# Patient Record
Sex: Male | Born: 1965 | Race: White | Hispanic: No | Smoking: Never smoker
Health system: Southern US, Community
[De-identification: ages and names within clinical notes are randomized; demographics above are authoritative.]

## PROBLEM LIST (undated history)

## (undated) DIAGNOSIS — G44009 Cluster headache syndrome, unspecified, not intractable: Secondary | ICD-10-CM

## (undated) DIAGNOSIS — I10 Essential (primary) hypertension: Secondary | ICD-10-CM

## (undated) DIAGNOSIS — F32A Depression, unspecified: Secondary | ICD-10-CM

## (undated) DIAGNOSIS — F329 Major depressive disorder, single episode, unspecified: Secondary | ICD-10-CM

---
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
## 2015-05-17 DIAGNOSIS — N182 Chronic kidney disease, stage 2 (mild): Secondary | ICD-10-CM | POA: Diagnosis not present

## 2015-05-17 DIAGNOSIS — F331 Major depressive disorder, recurrent, moderate: Secondary | ICD-10-CM | POA: Diagnosis not present

## 2015-05-17 DIAGNOSIS — Z6826 Body mass index (BMI) 26.0-26.9, adult: Secondary | ICD-10-CM | POA: Diagnosis not present

## 2015-05-17 DIAGNOSIS — I10 Essential (primary) hypertension: Secondary | ICD-10-CM | POA: Diagnosis not present

## 2015-10-01 DIAGNOSIS — Z6826 Body mass index (BMI) 26.0-26.9, adult: Secondary | ICD-10-CM | POA: Diagnosis not present

## 2015-10-01 DIAGNOSIS — F331 Major depressive disorder, recurrent, moderate: Secondary | ICD-10-CM | POA: Diagnosis not present

## 2015-10-01 DIAGNOSIS — N182 Chronic kidney disease, stage 2 (mild): Secondary | ICD-10-CM | POA: Diagnosis not present

## 2015-10-01 DIAGNOSIS — I10 Essential (primary) hypertension: Secondary | ICD-10-CM | POA: Diagnosis not present

## 2016-01-17 DIAGNOSIS — Z6825 Body mass index (BMI) 25.0-25.9, adult: Secondary | ICD-10-CM | POA: Diagnosis not present

## 2016-01-17 DIAGNOSIS — F331 Major depressive disorder, recurrent, moderate: Secondary | ICD-10-CM | POA: Diagnosis not present

## 2016-01-17 DIAGNOSIS — N182 Chronic kidney disease, stage 2 (mild): Secondary | ICD-10-CM | POA: Diagnosis not present

## 2016-01-17 DIAGNOSIS — I10 Essential (primary) hypertension: Secondary | ICD-10-CM | POA: Diagnosis not present

## 2016-04-21 DIAGNOSIS — I10 Essential (primary) hypertension: Secondary | ICD-10-CM | POA: Diagnosis not present

## 2016-04-21 DIAGNOSIS — F331 Major depressive disorder, recurrent, moderate: Secondary | ICD-10-CM | POA: Diagnosis not present

## 2016-04-21 DIAGNOSIS — Z6825 Body mass index (BMI) 25.0-25.9, adult: Secondary | ICD-10-CM | POA: Diagnosis not present

## 2016-04-21 DIAGNOSIS — N182 Chronic kidney disease, stage 2 (mild): Secondary | ICD-10-CM | POA: Diagnosis not present

## 2016-04-21 DIAGNOSIS — Z125 Encounter for screening for malignant neoplasm of prostate: Secondary | ICD-10-CM | POA: Diagnosis not present

## 2016-05-15 DIAGNOSIS — Z1211 Encounter for screening for malignant neoplasm of colon: Secondary | ICD-10-CM | POA: Diagnosis not present

## 2016-05-15 DIAGNOSIS — Z800 Family history of malignant neoplasm of digestive organs: Secondary | ICD-10-CM | POA: Diagnosis not present

## 2016-06-11 DIAGNOSIS — K635 Polyp of colon: Secondary | ICD-10-CM | POA: Diagnosis not present

## 2016-06-11 DIAGNOSIS — Z800 Family history of malignant neoplasm of digestive organs: Secondary | ICD-10-CM | POA: Diagnosis not present

## 2016-06-11 DIAGNOSIS — Z8371 Family history of colonic polyps: Secondary | ICD-10-CM | POA: Diagnosis not present

## 2016-06-19 DIAGNOSIS — K635 Polyp of colon: Secondary | ICD-10-CM | POA: Diagnosis not present

## 2016-06-26 DIAGNOSIS — Z800 Family history of malignant neoplasm of digestive organs: Secondary | ICD-10-CM | POA: Diagnosis not present

## 2016-06-26 DIAGNOSIS — Z8371 Family history of colonic polyps: Secondary | ICD-10-CM | POA: Diagnosis not present

## 2016-07-25 DIAGNOSIS — I10 Essential (primary) hypertension: Secondary | ICD-10-CM | POA: Diagnosis not present

## 2016-07-25 DIAGNOSIS — F331 Major depressive disorder, recurrent, moderate: Secondary | ICD-10-CM | POA: Diagnosis not present

## 2016-07-25 DIAGNOSIS — Z6826 Body mass index (BMI) 26.0-26.9, adult: Secondary | ICD-10-CM | POA: Diagnosis not present

## 2016-07-25 DIAGNOSIS — N182 Chronic kidney disease, stage 2 (mild): Secondary | ICD-10-CM | POA: Diagnosis not present

## 2016-10-30 DIAGNOSIS — N182 Chronic kidney disease, stage 2 (mild): Secondary | ICD-10-CM | POA: Diagnosis not present

## 2016-10-30 DIAGNOSIS — I10 Essential (primary) hypertension: Secondary | ICD-10-CM | POA: Diagnosis not present

## 2016-10-30 DIAGNOSIS — Z6825 Body mass index (BMI) 25.0-25.9, adult: Secondary | ICD-10-CM | POA: Diagnosis not present

## 2016-10-30 DIAGNOSIS — F331 Major depressive disorder, recurrent, moderate: Secondary | ICD-10-CM | POA: Diagnosis not present

## 2017-02-05 DIAGNOSIS — Z6826 Body mass index (BMI) 26.0-26.9, adult: Secondary | ICD-10-CM | POA: Diagnosis not present

## 2017-02-05 DIAGNOSIS — I10 Essential (primary) hypertension: Secondary | ICD-10-CM | POA: Diagnosis not present

## 2017-02-05 DIAGNOSIS — M25562 Pain in left knee: Secondary | ICD-10-CM | POA: Diagnosis not present

## 2017-02-05 DIAGNOSIS — N182 Chronic kidney disease, stage 2 (mild): Secondary | ICD-10-CM | POA: Diagnosis not present

## 2017-02-05 DIAGNOSIS — F331 Major depressive disorder, recurrent, moderate: Secondary | ICD-10-CM | POA: Diagnosis not present

## 2017-02-06 DIAGNOSIS — I10 Essential (primary) hypertension: Secondary | ICD-10-CM | POA: Diagnosis not present

## 2017-02-06 DIAGNOSIS — F331 Major depressive disorder, recurrent, moderate: Secondary | ICD-10-CM | POA: Diagnosis not present

## 2017-02-06 DIAGNOSIS — M25562 Pain in left knee: Secondary | ICD-10-CM | POA: Diagnosis not present

## 2017-02-06 DIAGNOSIS — N182 Chronic kidney disease, stage 2 (mild): Secondary | ICD-10-CM | POA: Diagnosis not present

## 2017-05-12 DIAGNOSIS — F331 Major depressive disorder, recurrent, moderate: Secondary | ICD-10-CM | POA: Diagnosis not present

## 2017-05-12 DIAGNOSIS — I10 Essential (primary) hypertension: Secondary | ICD-10-CM | POA: Diagnosis not present

## 2017-05-12 DIAGNOSIS — Z6826 Body mass index (BMI) 26.0-26.9, adult: Secondary | ICD-10-CM | POA: Diagnosis not present

## 2017-05-12 DIAGNOSIS — N182 Chronic kidney disease, stage 2 (mild): Secondary | ICD-10-CM | POA: Diagnosis not present

## 2017-08-19 DIAGNOSIS — I10 Essential (primary) hypertension: Secondary | ICD-10-CM | POA: Diagnosis not present

## 2017-08-19 DIAGNOSIS — Z6826 Body mass index (BMI) 26.0-26.9, adult: Secondary | ICD-10-CM | POA: Diagnosis not present

## 2017-08-19 DIAGNOSIS — E782 Mixed hyperlipidemia: Secondary | ICD-10-CM | POA: Diagnosis not present

## 2017-08-19 DIAGNOSIS — Z0000 Encounter for general adult medical examination without abnormal findings: Secondary | ICD-10-CM | POA: Diagnosis not present

## 2017-08-19 DIAGNOSIS — Z125 Encounter for screening for malignant neoplasm of prostate: Secondary | ICD-10-CM | POA: Diagnosis not present

## 2017-12-01 DIAGNOSIS — M7712 Lateral epicondylitis, left elbow: Secondary | ICD-10-CM | POA: Diagnosis not present

## 2017-12-01 DIAGNOSIS — I10 Essential (primary) hypertension: Secondary | ICD-10-CM | POA: Diagnosis not present

## 2017-12-01 DIAGNOSIS — Z6826 Body mass index (BMI) 26.0-26.9, adult: Secondary | ICD-10-CM | POA: Diagnosis not present

## 2018-03-16 DIAGNOSIS — Z6826 Body mass index (BMI) 26.0-26.9, adult: Secondary | ICD-10-CM | POA: Diagnosis not present

## 2018-03-16 DIAGNOSIS — N182 Chronic kidney disease, stage 2 (mild): Secondary | ICD-10-CM | POA: Diagnosis not present

## 2018-03-16 DIAGNOSIS — F3342 Major depressive disorder, recurrent, in full remission: Secondary | ICD-10-CM | POA: Diagnosis not present

## 2018-03-16 DIAGNOSIS — E785 Hyperlipidemia, unspecified: Secondary | ICD-10-CM | POA: Diagnosis not present

## 2018-03-16 DIAGNOSIS — I10 Essential (primary) hypertension: Secondary | ICD-10-CM | POA: Diagnosis not present

## 2018-03-16 DIAGNOSIS — M7712 Lateral epicondylitis, left elbow: Secondary | ICD-10-CM | POA: Diagnosis not present

## 2018-03-16 DIAGNOSIS — G43919 Migraine, unspecified, intractable, without status migrainosus: Secondary | ICD-10-CM | POA: Diagnosis not present

## 2018-05-26 DIAGNOSIS — J309 Allergic rhinitis, unspecified: Secondary | ICD-10-CM | POA: Diagnosis not present

## 2018-05-26 DIAGNOSIS — Z6826 Body mass index (BMI) 26.0-26.9, adult: Secondary | ICD-10-CM | POA: Diagnosis not present

## 2018-06-24 DIAGNOSIS — L309 Dermatitis, unspecified: Secondary | ICD-10-CM | POA: Diagnosis not present

## 2018-06-24 DIAGNOSIS — J309 Allergic rhinitis, unspecified: Secondary | ICD-10-CM | POA: Diagnosis not present

## 2018-06-24 DIAGNOSIS — N182 Chronic kidney disease, stage 2 (mild): Secondary | ICD-10-CM | POA: Diagnosis not present

## 2018-06-24 DIAGNOSIS — F325 Major depressive disorder, single episode, in full remission: Secondary | ICD-10-CM | POA: Diagnosis not present

## 2018-08-10 DIAGNOSIS — H2513 Age-related nuclear cataract, bilateral: Secondary | ICD-10-CM | POA: Diagnosis not present

## 2018-10-04 DIAGNOSIS — N182 Chronic kidney disease, stage 2 (mild): Secondary | ICD-10-CM | POA: Diagnosis not present

## 2018-10-04 DIAGNOSIS — Z125 Encounter for screening for malignant neoplasm of prostate: Secondary | ICD-10-CM | POA: Diagnosis not present

## 2018-10-04 DIAGNOSIS — Z6826 Body mass index (BMI) 26.0-26.9, adult: Secondary | ICD-10-CM | POA: Diagnosis not present

## 2018-10-04 DIAGNOSIS — Z0000 Encounter for general adult medical examination without abnormal findings: Secondary | ICD-10-CM | POA: Diagnosis not present

## 2018-11-10 DIAGNOSIS — H019 Unspecified inflammation of eyelid: Secondary | ICD-10-CM | POA: Diagnosis not present

## 2018-11-10 DIAGNOSIS — Z6826 Body mass index (BMI) 26.0-26.9, adult: Secondary | ICD-10-CM | POA: Diagnosis not present

## 2019-01-10 DIAGNOSIS — G43909 Migraine, unspecified, not intractable, without status migrainosus: Secondary | ICD-10-CM | POA: Diagnosis not present

## 2019-01-10 DIAGNOSIS — E7849 Other hyperlipidemia: Secondary | ICD-10-CM | POA: Diagnosis not present

## 2019-01-10 DIAGNOSIS — N182 Chronic kidney disease, stage 2 (mild): Secondary | ICD-10-CM | POA: Diagnosis not present

## 2019-01-10 DIAGNOSIS — I10 Essential (primary) hypertension: Secondary | ICD-10-CM | POA: Diagnosis not present

## 2019-02-22 DIAGNOSIS — Z6827 Body mass index (BMI) 27.0-27.9, adult: Secondary | ICD-10-CM | POA: Diagnosis not present

## 2019-02-22 DIAGNOSIS — J111 Influenza due to unidentified influenza virus with other respiratory manifestations: Secondary | ICD-10-CM | POA: Diagnosis not present

## 2019-04-18 DIAGNOSIS — N182 Chronic kidney disease, stage 2 (mild): Secondary | ICD-10-CM | POA: Diagnosis not present

## 2019-04-18 DIAGNOSIS — E7849 Other hyperlipidemia: Secondary | ICD-10-CM | POA: Diagnosis not present

## 2019-04-18 DIAGNOSIS — G43909 Migraine, unspecified, not intractable, without status migrainosus: Secondary | ICD-10-CM | POA: Diagnosis not present

## 2019-04-18 DIAGNOSIS — I10 Essential (primary) hypertension: Secondary | ICD-10-CM | POA: Diagnosis not present

## 2019-05-20 ENCOUNTER — Ambulatory Visit: Payer: Self-pay

## 2019-06-14 ENCOUNTER — Other Ambulatory Visit: Payer: Self-pay

## 2019-06-14 ENCOUNTER — Ambulatory Visit: Payer: BC Managed Care – PPO | Attending: Internal Medicine

## 2019-06-14 DIAGNOSIS — Z20822 Contact with and (suspected) exposure to covid-19: Secondary | ICD-10-CM

## 2019-06-15 LAB — SARS-COV-2, NAA 2 DAY TAT

## 2019-06-15 LAB — NOVEL CORONAVIRUS, NAA: SARS-CoV-2, NAA: NOT DETECTED

## 2019-06-16 ENCOUNTER — Telehealth: Payer: Self-pay | Admitting: General Practice

## 2019-06-16 NOTE — Telephone Encounter (Signed)
Negative COVID results given. Patient results "NOT Detected." Caller expressed understanding. ° °

## 2019-06-24 DIAGNOSIS — Z125 Encounter for screening for malignant neoplasm of prostate: Secondary | ICD-10-CM | POA: Diagnosis not present

## 2019-06-24 DIAGNOSIS — Z8042 Family history of malignant neoplasm of prostate: Secondary | ICD-10-CM | POA: Diagnosis not present

## 2019-07-28 DIAGNOSIS — N182 Chronic kidney disease, stage 2 (mild): Secondary | ICD-10-CM | POA: Diagnosis not present

## 2019-07-28 DIAGNOSIS — I10 Essential (primary) hypertension: Secondary | ICD-10-CM | POA: Diagnosis not present

## 2019-07-28 DIAGNOSIS — G43909 Migraine, unspecified, not intractable, without status migrainosus: Secondary | ICD-10-CM | POA: Diagnosis not present

## 2019-07-28 DIAGNOSIS — E7849 Other hyperlipidemia: Secondary | ICD-10-CM | POA: Diagnosis not present

## 2019-09-21 ENCOUNTER — Emergency Department (HOSPITAL_COMMUNITY)
Admission: EM | Admit: 2019-09-21 | Discharge: 2019-09-21 | Disposition: A | Discharge status: Home / Self Care | Payer: BC Managed Care – PPO | Attending: Emergency Medicine | Admitting: Emergency Medicine

## 2019-09-21 ENCOUNTER — Other Ambulatory Visit: Payer: Self-pay

## 2019-09-21 ENCOUNTER — Emergency Department (HOSPITAL_COMMUNITY): Payer: BC Managed Care – PPO

## 2019-09-21 ENCOUNTER — Encounter (HOSPITAL_COMMUNITY): Payer: Self-pay | Admitting: Emergency Medicine

## 2019-09-21 DIAGNOSIS — N132 Hydronephrosis with renal and ureteral calculous obstruction: Secondary | ICD-10-CM | POA: Diagnosis not present

## 2019-09-21 DIAGNOSIS — R109 Unspecified abdominal pain: Secondary | ICD-10-CM | POA: Diagnosis not present

## 2019-09-21 DIAGNOSIS — N201 Calculus of ureter: Secondary | ICD-10-CM

## 2019-09-21 DIAGNOSIS — K449 Diaphragmatic hernia without obstruction or gangrene: Secondary | ICD-10-CM | POA: Diagnosis not present

## 2019-09-21 DIAGNOSIS — I10 Essential (primary) hypertension: Secondary | ICD-10-CM | POA: Diagnosis not present

## 2019-09-21 DIAGNOSIS — R1084 Generalized abdominal pain: Secondary | ICD-10-CM | POA: Diagnosis not present

## 2019-09-21 DIAGNOSIS — N281 Cyst of kidney, acquired: Secondary | ICD-10-CM | POA: Diagnosis not present

## 2019-09-21 DIAGNOSIS — N2889 Other specified disorders of kidney and ureter: Secondary | ICD-10-CM | POA: Diagnosis not present

## 2019-09-21 DIAGNOSIS — R52 Pain, unspecified: Secondary | ICD-10-CM | POA: Diagnosis not present

## 2019-09-21 DIAGNOSIS — I700 Atherosclerosis of aorta: Secondary | ICD-10-CM | POA: Diagnosis not present

## 2019-09-21 DIAGNOSIS — R0902 Hypoxemia: Secondary | ICD-10-CM | POA: Diagnosis not present

## 2019-09-21 DIAGNOSIS — N289 Disorder of kidney and ureter, unspecified: Secondary | ICD-10-CM

## 2019-09-21 HISTORY — DX: Essential (primary) hypertension: I10

## 2019-09-21 HISTORY — DX: Cluster headache syndrome, unspecified, not intractable: G44.009

## 2019-09-21 HISTORY — DX: Depression, unspecified: F32.A

## 2019-09-21 LAB — CBC
HCT: 48.1 % (ref 39.0–52.0)
Hemoglobin: 15.8 g/dL (ref 13.0–17.0)
MCH: 29.4 pg (ref 26.0–34.0)
MCHC: 32.8 g/dL (ref 30.0–36.0)
MCV: 89.4 fL (ref 80.0–100.0)
Platelets: 222 10*3/uL (ref 150–400)
RBC: 5.38 MIL/uL (ref 4.22–5.81)
RDW: 14.5 % (ref 11.5–15.5)
WBC: 8.8 10*3/uL (ref 4.0–10.5)
nRBC: 0 % (ref 0.0–0.2)

## 2019-09-21 LAB — URINALYSIS, ROUTINE W REFLEX MICROSCOPIC
Bacteria, UA: NONE SEEN
Bilirubin Urine: NEGATIVE
Glucose, UA: NEGATIVE mg/dL
Ketones, ur: NEGATIVE mg/dL
Leukocytes,Ua: NEGATIVE
Nitrite: NEGATIVE
Protein, ur: NEGATIVE mg/dL
Specific Gravity, Urine: >1.046 — ABNORMAL HIGH (ref 1.005–1.030)
pH: 7 (ref 5.0–8.0)

## 2019-09-21 LAB — COMPREHENSIVE METABOLIC PANEL
ALT: 33 U/L (ref 0–44)
AST: 26 U/L (ref 15–41)
Albumin: 4.2 g/dL (ref 3.5–5.0)
Alkaline Phosphatase: 37 U/L — ABNORMAL LOW (ref 38–126)
Anion gap: 10 (ref 5–15)
BUN: 28 mg/dL — ABNORMAL HIGH (ref 6–20)
CO2: 24 mmol/L (ref 22–32)
Calcium: 9.5 mg/dL (ref 8.9–10.3)
Chloride: 106 mmol/L (ref 98–111)
Creatinine, Ser: 1.87 mg/dL — ABNORMAL HIGH (ref 0.61–1.24)
GFR calc Af Amer: 46 mL/min — ABNORMAL LOW (ref >60)
GFR calc non Af Amer: 40 mL/min — ABNORMAL LOW (ref >60)
Glucose, Bld: 121 mg/dL — ABNORMAL HIGH (ref 70–99)
Potassium: 4.6 mmol/L (ref 3.5–5.1)
Sodium: 140 mmol/L (ref 135–145)
Total Bilirubin: 0.6 mg/dL (ref 0.3–1.2)
Total Protein: 7.1 g/dL (ref 6.5–8.1)

## 2019-09-21 LAB — LIPASE, BLOOD: Lipase: 26 U/L (ref 11–51)

## 2019-09-21 MED ORDER — ONDANSETRON 4 MG PO TBDP
4.0000 mg | ORAL_TABLET | Freq: Once | ORAL | Status: AC
  Administered 2019-09-21: 4 mg via ORAL
  Filled 2019-09-21: qty 1

## 2019-09-21 MED ORDER — ONDANSETRON 4 MG PO TBDP: 4.0000 mg | ORAL_TABLET | Freq: Three times a day (TID) | ORAL | 0 refills | Status: AC | PRN

## 2019-09-21 MED ORDER — ONDANSETRON 4 MG PO TBDP: 4.0000 mg | ORAL_TABLET | Freq: Once | ORAL | Status: DC

## 2019-09-21 MED ORDER — OXYCODONE-ACETAMINOPHEN 5-325 MG PO TABS: 1.0000 | ORAL_TABLET | ORAL | 0 refills | Status: AC | PRN

## 2019-09-21 MED ORDER — OXYCODONE-ACETAMINOPHEN 5-325 MG PO TABS
1.0000 | ORAL_TABLET | Freq: Once | ORAL | Status: AC
  Administered 2019-09-21: 1 via ORAL
  Filled 2019-09-21: qty 1

## 2019-09-21 MED ORDER — OXYCODONE-ACETAMINOPHEN 5-325 MG PO TABS: 1.0000 | ORAL_TABLET | ORAL | Status: DC | PRN

## 2019-09-21 MED ORDER — IOHEXOL 300 MG/ML  SOLN
75.0000 mL | Freq: Once | INTRAMUSCULAR | Status: AC | PRN
  Administered 2019-09-21: 75 mL via INTRAVENOUS

## 2019-09-21 MED ORDER — TAMSULOSIN HCL 0.4 MG PO CAPS: 0.4000 mg | ORAL_CAPSULE | Freq: Every day | ORAL | 0 refills | Status: AC

## 2019-09-21 NOTE — Discharge Instructions (Addendum)
Follow up with [facility] for management of kidney stones. Take medications as prescribed.   Return to the ED if you develop any fever, have uncontrolled pain or uncontrolled vomiting.

## 2019-09-21 NOTE — ED Triage Notes (Signed)
Arrives via EMS from work, C/C abd pain. Patient thinks he is constipated, he had a small bowel movement this morning, but a regular BM yesterday. LLQ abdominal pain, intermittent since Monday.   Vitals with EMS P 64 RR 16 BP 137/88 O2 97% CBG 108 T 97.2

## 2019-09-21 NOTE — ED Provider Notes (Signed)
[facility]-EMERGENCY DEPT Provider Note   CSN: [id number] Arrival date & time: 09/21/19  1450     History Chief Complaint  Patient presents with  . Abdominal Pain    Dakota King is a 54 y.o. male.  Patient to ED with left sided abdominal and flank pain that started 3 days ago, got better then came back today worse than previously. No nausea, vomiting, fever, hematuria. No history of kidney stones. He felt he may have been constipated but has taken a laxative and has had bowel movements without change in the pain.   The history is provided by the patient. No language interpreter was used.  Abdominal Pain Associated symptoms: no chest pain, no chills, no dysuria, no fever, no hematuria and no vomiting        Past Medical History:  Diagnosis Date  . Depression   . Hypertension   . Migraine-cluster headache syndrome     There are no problems to display for this patient.   History reviewed. No pertinent surgical history.     No family history on file.  Social History   Tobacco Use  . Smoking status: Never Smoker  . Smokeless tobacco: Never Used  Vaping Use  . Vaping Use: Never used  Substance Use Topics  . Alcohol use: Not Currently  . Drug use: Not Currently    Home Medications Prior to Admission medications   Not on File    Allergies    Penicillins and Sulfa antibiotics  Review of Systems   Review of Systems  Constitutional: Negative for chills and fever.  Cardiovascular: Negative.  Negative for chest pain.  Gastrointestinal: Positive for abdominal pain. Negative for vomiting.  Genitourinary: Positive for flank pain. Negative for dysuria, hematuria and testicular pain.  Skin: Negative.   Neurological: Negative.     Physical Exam Updated Vital Signs BP 127/84   Pulse 72   Temp 97.8 F (36.6 C) (Oral)   Resp 18   Ht 5\' 8"  (1.727 m)   Wt 83.9 kg   SpO2 97%   BMI 28.13 kg/m   Physical Exam Vitals and nursing note  reviewed.  Constitutional:      Appearance: He is well-developed.  Pulmonary:     Effort: Pulmonary effort is normal.  Abdominal:     General: Abdomen is flat.     Palpations: Abdomen is soft.     Tenderness: There is no abdominal tenderness.  Musculoskeletal:        General: Normal range of motion.     Cervical back: Normal range of motion.  Skin:    General: Skin is warm and dry.  Neurological:     Mental Status: He is alert and oriented to person, place, and time.     ED Results / Procedures / Treatments   Labs (all labs ordered are listed, but only abnormal results are displayed) Labs Reviewed  COMPREHENSIVE METABOLIC PANEL - Abnormal; Notable for the following components:      Result Value   Glucose, Bld 121 (*)    BUN 28 (*)    Creatinine, Ser 1.87 (*)    Alkaline Phosphatase 37 (*)    GFR calc non Af Amer 40 (*)    GFR calc Af Amer 46 (*)    All other components within normal limits  URINALYSIS, ROUTINE W REFLEX MICROSCOPIC - Abnormal; Notable for the following components:   Specific Gravity, Urine >1.046 (*)    Hgb urine dipstick SMALL (*)  All other components within normal limits  LIPASE, BLOOD  CBC    EKG None  Radiology CT ABDOMEN PELVIS W CONTRAST  Result Date: 09/21/2019 CLINICAL DATA:  Left flank pain and left lower quadrant pain. Constipation. EXAM: CT ABDOMEN AND PELVIS WITH CONTRAST TECHNIQUE: Multidetector CT imaging of the abdomen and pelvis was performed using the standard protocol following bolus administration of intravenous contrast. CONTRAST:  49mL OMNIPAQUE IOHEXOL 300 MG/ML  SOLN COMPARISON:  None. FINDINGS: Lower Chest: No acute findings. Hepatobiliary: No hepatic masses identified. Gallbladder is unremarkable. No evidence of biliary ductal dilatation. Pancreas:  No mass or inflammatory changes. Spleen: Within normal limits in size and appearance. Adrenals/Urinary Tract: Multiple tiny 2-3 mm left renal calculi are seen. Mild left  hydroureteronephrosis is seen due to 2 adjacent 3 mm calculi in the distal left ureter. A few left renal cysts are noted, however there is no evidence of renal masses. Unremarkable unopacified urinary bladder. Stomach/Bowel: Small to moderate hiatal hernia is seen. No evidence of obstruction, inflammatory process or abnormal fluid collections. Normal appendix visualized. Vascular/Lymphatic: No pathologically enlarged lymph nodes. No abdominal aortic aneurysm. Aortic atherosclerosis noted. Reproductive:  No mass or other significant abnormality. Other:  None. Musculoskeletal:  No suspicious bone lesions identified. IMPRESSION: Mild left hydroureteronephrosis due to two adjacent 3 mm calculi in the distal left ureter. Left nephrolithiasis. Small to moderate hiatal hernia. Aortic Atherosclerosis (ICD10-I70.0). Electronically Signed   By: Danae Orleans M.D.   On: 09/21/2019 19:30    Procedures Procedures (including critical care time)  Medications Ordered in ED Medications  oxyCODONE-acetaminophen (PERCOCET/ROXICET) 5-325 MG per tablet 1 tablet (1 tablet Oral Given 09/21/19 1756)  iohexol (OMNIPAQUE) 300 MG/ML solution 75 mL (75 mLs Intravenous Contrast Given 09/21/19 1904)  oxyCODONE-acetaminophen (PERCOCET/ROXICET) 5-325 MG per tablet 1 tablet (1 tablet Oral Given 09/21/19 2048)  ondansetron (ZOFRAN-ODT) disintegrating tablet 4 mg (4 mg Oral Given 09/21/19 2048)    ED Course  I have reviewed the triage vital signs and the nursing notes.  Pertinent labs & imaging results that were available during my care of the patient were reviewed by me and considered in my medical decision making (see chart for details).    MDM Rules/Calculators/A&P                          Patient to ED with ss/sxs as per HPI. He was given an oral pain medication on arrival to the ED and reports significant improvement in pain. No fever.   Labs show an elevated Cr at 1.87. No comparables. The patient reports he is aware of mild  renal dysfunction as discussed with his doctor.   CT shows distal ureterolithiasis with hydronephrosis on left.  Pain controlled, no vomiting or fever. He can be discharged with urology follow up in the outpatient setting.    Final Clinical Impression(s) / ED Diagnoses Final diagnoses:  None   1. Let ureterolithiasis   Rx / DC Orders ED Discharge Orders    None       Danne Harbor 09/21/19 2310    Charlynne Pander, MD 09/21/19 [id number]

## 2019-09-21 NOTE — ED Provider Notes (Signed)
Patient placed in Quick Look pathway, seen and evaluated   Chief Complaint: abd pain  HPI:   54y/o M presenting for eval of abd pain that started 3 days ago. Pain initially started out intermittently but how now become constant. Pain located to the LLQ, LUQ and left flank. Associated nausea, but denies vomiting. Has had some constipation as well. Did have normal BM yesterday after taking otc laxative. Small BM today today. Denies urinary complaints or fevers. Has had colonoscopy before that he states was wnl.  Monday llq and left flank. Mainly left flank now.   ROS: abd pain, nausea (one)  Physical Exam:   Gen: No distress  Neuro: Awake and Alert  Skin: Warm    Focused Exam: TTP to the LUQ, LLQ, and left CVA. Guarding noted to the left mid abdomen.   Initiation of care has begun. The patient has been counseled on the process, plan, and necessity for staying for the completion/evaluation, and the remainder of the medical screening examination    Karrie Meres, PA-C 09/21/19 1755    Charlynne Pander, MD 09/21/19 2206

## 2019-09-29 DIAGNOSIS — N281 Cyst of kidney, acquired: Secondary | ICD-10-CM | POA: Diagnosis not present

## 2019-09-29 DIAGNOSIS — N202 Calculus of kidney with calculus of ureter: Secondary | ICD-10-CM | POA: Diagnosis not present

## 2019-10-17 DIAGNOSIS — N201 Calculus of ureter: Secondary | ICD-10-CM | POA: Diagnosis not present

## 2019-10-17 DIAGNOSIS — N281 Cyst of kidney, acquired: Secondary | ICD-10-CM | POA: Diagnosis not present

## 2019-11-01 DIAGNOSIS — Z6827 Body mass index (BMI) 27.0-27.9, adult: Secondary | ICD-10-CM | POA: Diagnosis not present

## 2019-11-01 DIAGNOSIS — Z0000 Encounter for general adult medical examination without abnormal findings: Secondary | ICD-10-CM | POA: Diagnosis not present

## 2019-12-20 DIAGNOSIS — N281 Cyst of kidney, acquired: Secondary | ICD-10-CM | POA: Diagnosis not present

## 2019-12-20 DIAGNOSIS — N201 Calculus of ureter: Secondary | ICD-10-CM | POA: Diagnosis not present

## 2019-12-20 DIAGNOSIS — Z125 Encounter for screening for malignant neoplasm of prostate: Secondary | ICD-10-CM | POA: Diagnosis not present

## 2020-01-31 DIAGNOSIS — E7849 Other hyperlipidemia: Secondary | ICD-10-CM | POA: Diagnosis not present

## 2020-01-31 DIAGNOSIS — G43909 Migraine, unspecified, not intractable, without status migrainosus: Secondary | ICD-10-CM | POA: Diagnosis not present

## 2020-01-31 DIAGNOSIS — J309 Allergic rhinitis, unspecified: Secondary | ICD-10-CM | POA: Diagnosis not present

## 2020-01-31 DIAGNOSIS — I10 Essential (primary) hypertension: Secondary | ICD-10-CM | POA: Diagnosis not present

## 2020-02-17 DIAGNOSIS — I10 Essential (primary) hypertension: Secondary | ICD-10-CM | POA: Diagnosis not present

## 2020-02-17 DIAGNOSIS — G43909 Migraine, unspecified, not intractable, without status migrainosus: Secondary | ICD-10-CM | POA: Diagnosis not present

## 2020-02-17 DIAGNOSIS — E7849 Other hyperlipidemia: Secondary | ICD-10-CM | POA: Diagnosis not present

## 2020-02-17 DIAGNOSIS — J309 Allergic rhinitis, unspecified: Secondary | ICD-10-CM | POA: Diagnosis not present

## 2020-03-26 DIAGNOSIS — E7849 Other hyperlipidemia: Secondary | ICD-10-CM | POA: Diagnosis not present

## 2020-03-26 DIAGNOSIS — J309 Allergic rhinitis, unspecified: Secondary | ICD-10-CM | POA: Diagnosis not present

## 2020-03-26 DIAGNOSIS — G43909 Migraine, unspecified, not intractable, without status migrainosus: Secondary | ICD-10-CM | POA: Diagnosis not present

## 2020-03-26 DIAGNOSIS — I10 Essential (primary) hypertension: Secondary | ICD-10-CM | POA: Diagnosis not present

## 2020-04-30 DIAGNOSIS — J309 Allergic rhinitis, unspecified: Secondary | ICD-10-CM | POA: Diagnosis not present

## 2020-04-30 DIAGNOSIS — I10 Essential (primary) hypertension: Secondary | ICD-10-CM | POA: Diagnosis not present

## 2020-04-30 DIAGNOSIS — E7849 Other hyperlipidemia: Secondary | ICD-10-CM | POA: Diagnosis not present

## 2020-04-30 DIAGNOSIS — G43909 Migraine, unspecified, not intractable, without status migrainosus: Secondary | ICD-10-CM | POA: Diagnosis not present

## 2020-06-11 DIAGNOSIS — S93401A Sprain of unspecified ligament of right ankle, initial encounter: Secondary | ICD-10-CM | POA: Diagnosis not present

## 2020-08-27 DIAGNOSIS — Z6827 Body mass index (BMI) 27.0-27.9, adult: Secondary | ICD-10-CM | POA: Diagnosis not present

## 2020-08-27 DIAGNOSIS — I700 Atherosclerosis of aorta: Secondary | ICD-10-CM | POA: Diagnosis not present

## 2020-08-27 DIAGNOSIS — E7849 Other hyperlipidemia: Secondary | ICD-10-CM | POA: Diagnosis not present

## 2020-08-27 DIAGNOSIS — N182 Chronic kidney disease, stage 2 (mild): Secondary | ICD-10-CM | POA: Diagnosis not present

## 2020-08-27 DIAGNOSIS — Z125 Encounter for screening for malignant neoplasm of prostate: Secondary | ICD-10-CM | POA: Diagnosis not present

## 2020-08-27 DIAGNOSIS — S93401A Sprain of unspecified ligament of right ankle, initial encounter: Secondary | ICD-10-CM | POA: Diagnosis not present

## 2020-12-03 DIAGNOSIS — E7849 Other hyperlipidemia: Secondary | ICD-10-CM | POA: Diagnosis not present

## 2020-12-03 DIAGNOSIS — J111 Influenza due to unidentified influenza virus with other respiratory manifestations: Secondary | ICD-10-CM | POA: Diagnosis not present

## 2020-12-03 DIAGNOSIS — I700 Atherosclerosis of aorta: Secondary | ICD-10-CM | POA: Diagnosis not present

## 2020-12-03 DIAGNOSIS — N182 Chronic kidney disease, stage 2 (mild): Secondary | ICD-10-CM | POA: Diagnosis not present

## 2020-12-19 DIAGNOSIS — N402 Nodular prostate without lower urinary tract symptoms: Secondary | ICD-10-CM | POA: Diagnosis not present

## 2020-12-19 DIAGNOSIS — Z87442 Personal history of urinary calculi: Secondary | ICD-10-CM | POA: Diagnosis not present

## 2020-12-19 DIAGNOSIS — N281 Cyst of kidney, acquired: Secondary | ICD-10-CM | POA: Diagnosis not present

## 2020-12-21 ENCOUNTER — Other Ambulatory Visit: Payer: Self-pay | Admitting: Urology

## 2020-12-21 DIAGNOSIS — N402 Nodular prostate without lower urinary tract symptoms: Secondary | ICD-10-CM

## 2020-12-21 DIAGNOSIS — Z8042 Family history of malignant neoplasm of prostate: Secondary | ICD-10-CM

## 2021-01-19 ENCOUNTER — Ambulatory Visit
Admission: RE | Admit: 2021-01-19 | Discharge: 2021-01-19 | Disposition: A | Discharge status: Home / Self Care | Payer: BC Managed Care – PPO | Source: Ambulatory Visit | Attending: Urology | Admitting: Urology

## 2021-01-19 ENCOUNTER — Other Ambulatory Visit: Payer: Self-pay

## 2021-01-19 DIAGNOSIS — Z8042 Family history of malignant neoplasm of prostate: Secondary | ICD-10-CM

## 2021-01-19 DIAGNOSIS — K5730 Diverticulosis of large intestine without perforation or abscess without bleeding: Secondary | ICD-10-CM | POA: Diagnosis not present

## 2021-01-19 DIAGNOSIS — R590 Localized enlarged lymph nodes: Secondary | ICD-10-CM | POA: Diagnosis not present

## 2021-01-19 DIAGNOSIS — N402 Nodular prostate without lower urinary tract symptoms: Secondary | ICD-10-CM

## 2021-01-19 DIAGNOSIS — N400 Enlarged prostate without lower urinary tract symptoms: Secondary | ICD-10-CM | POA: Diagnosis not present

## 2021-01-19 MED ORDER — GADOBENATE DIMEGLUMINE 529 MG/ML IV SOLN
17.0000 mL | Freq: Once | INTRAVENOUS | Status: AC | PRN
  Administered 2021-01-19: 17 mL via INTRAVENOUS

## 2021-03-04 DIAGNOSIS — N402 Nodular prostate without lower urinary tract symptoms: Secondary | ICD-10-CM | POA: Diagnosis not present

## 2021-03-04 DIAGNOSIS — C61 Malignant neoplasm of prostate: Secondary | ICD-10-CM | POA: Diagnosis not present

## 2021-03-04 DIAGNOSIS — D075 Carcinoma in situ of prostate: Secondary | ICD-10-CM | POA: Diagnosis not present

## 2021-03-05 DIAGNOSIS — Z0000 Encounter for general adult medical examination without abnormal findings: Secondary | ICD-10-CM | POA: Diagnosis not present

## 2021-03-22 DIAGNOSIS — C61 Malignant neoplasm of prostate: Secondary | ICD-10-CM | POA: Diagnosis not present

## 2021-05-09 DIAGNOSIS — Z6827 Body mass index (BMI) 27.0-27.9, adult: Secondary | ICD-10-CM | POA: Diagnosis not present

## 2021-05-09 DIAGNOSIS — J09X3 Influenza due to identified novel influenza A virus with gastrointestinal manifestations: Secondary | ICD-10-CM | POA: Diagnosis not present

## 2021-06-06 DIAGNOSIS — I700 Atherosclerosis of aorta: Secondary | ICD-10-CM | POA: Diagnosis not present

## 2021-06-06 DIAGNOSIS — E7849 Other hyperlipidemia: Secondary | ICD-10-CM | POA: Diagnosis not present

## 2021-06-06 DIAGNOSIS — J301 Allergic rhinitis due to pollen: Secondary | ICD-10-CM | POA: Diagnosis not present

## 2021-06-06 DIAGNOSIS — I10 Essential (primary) hypertension: Secondary | ICD-10-CM | POA: Diagnosis not present

## 2021-06-06 DIAGNOSIS — N182 Chronic kidney disease, stage 2 (mild): Secondary | ICD-10-CM | POA: Diagnosis not present

## 2021-06-11 DIAGNOSIS — R109 Unspecified abdominal pain: Secondary | ICD-10-CM | POA: Diagnosis not present

## 2021-06-12 DIAGNOSIS — E7849 Other hyperlipidemia: Secondary | ICD-10-CM | POA: Diagnosis not present

## 2021-06-12 DIAGNOSIS — Z6827 Body mass index (BMI) 27.0-27.9, adult: Secondary | ICD-10-CM | POA: Diagnosis not present

## 2021-06-12 DIAGNOSIS — N182 Chronic kidney disease, stage 2 (mild): Secondary | ICD-10-CM | POA: Diagnosis not present

## 2021-06-12 DIAGNOSIS — N200 Calculus of kidney: Secondary | ICD-10-CM | POA: Diagnosis not present

## 2021-06-12 DIAGNOSIS — K5740 Diverticulitis of both small and large intestine with perforation and abscess without bleeding: Secondary | ICD-10-CM | POA: Diagnosis not present

## 2021-06-26 DIAGNOSIS — N200 Calculus of kidney: Secondary | ICD-10-CM | POA: Diagnosis not present

## 2021-06-26 DIAGNOSIS — N281 Cyst of kidney, acquired: Secondary | ICD-10-CM | POA: Diagnosis not present

## 2021-09-13 DIAGNOSIS — C61 Malignant neoplasm of prostate: Secondary | ICD-10-CM | POA: Diagnosis not present

## 2021-09-20 DIAGNOSIS — C61 Malignant neoplasm of prostate: Secondary | ICD-10-CM | POA: Diagnosis not present

## 2021-10-01 DIAGNOSIS — Z6827 Body mass index (BMI) 27.0-27.9, adult: Secondary | ICD-10-CM | POA: Diagnosis not present

## 2021-10-01 DIAGNOSIS — K529 Noninfective gastroenteritis and colitis, unspecified: Secondary | ICD-10-CM | POA: Diagnosis not present

## 2021-10-29 DIAGNOSIS — Z800 Family history of malignant neoplasm of digestive organs: Secondary | ICD-10-CM | POA: Diagnosis not present

## 2021-10-29 DIAGNOSIS — Z1211 Encounter for screening for malignant neoplasm of colon: Secondary | ICD-10-CM | POA: Diagnosis not present

## 2021-11-18 DIAGNOSIS — J309 Allergic rhinitis, unspecified: Secondary | ICD-10-CM | POA: Diagnosis not present

## 2021-11-18 DIAGNOSIS — I700 Atherosclerosis of aorta: Secondary | ICD-10-CM | POA: Diagnosis not present

## 2021-11-18 DIAGNOSIS — I10 Essential (primary) hypertension: Secondary | ICD-10-CM | POA: Diagnosis not present

## 2021-11-18 DIAGNOSIS — D075 Carcinoma in situ of prostate: Secondary | ICD-10-CM | POA: Diagnosis not present

## 2021-11-18 DIAGNOSIS — E7849 Other hyperlipidemia: Secondary | ICD-10-CM | POA: Diagnosis not present

## 2021-11-18 DIAGNOSIS — N182 Chronic kidney disease, stage 2 (mild): Secondary | ICD-10-CM | POA: Diagnosis not present

## 2021-12-06 DIAGNOSIS — K635 Polyp of colon: Secondary | ICD-10-CM | POA: Diagnosis not present

## 2021-12-06 DIAGNOSIS — Z1211 Encounter for screening for malignant neoplasm of colon: Secondary | ICD-10-CM | POA: Diagnosis not present

## 2021-12-17 DIAGNOSIS — K635 Polyp of colon: Secondary | ICD-10-CM | POA: Diagnosis not present

## 2022-01-03 DIAGNOSIS — Z6828 Body mass index (BMI) 28.0-28.9, adult: Secondary | ICD-10-CM | POA: Diagnosis not present

## 2022-01-03 DIAGNOSIS — K648 Other hemorrhoids: Secondary | ICD-10-CM | POA: Diagnosis not present

## 2022-01-03 DIAGNOSIS — K644 Residual hemorrhoidal skin tags: Secondary | ICD-10-CM | POA: Diagnosis not present

## 2022-01-03 DIAGNOSIS — D369 Benign neoplasm, unspecified site: Secondary | ICD-10-CM | POA: Diagnosis not present

## 2022-01-03 DIAGNOSIS — K5790 Diverticulosis of intestine, part unspecified, without perforation or abscess without bleeding: Secondary | ICD-10-CM | POA: Diagnosis not present

## 2022-02-06 DIAGNOSIS — Z6827 Body mass index (BMI) 27.0-27.9, adult: Secondary | ICD-10-CM | POA: Diagnosis not present

## 2022-02-06 DIAGNOSIS — K5753 Diverticulitis of both small and large intestine without perforation or abscess with bleeding: Secondary | ICD-10-CM | POA: Diagnosis not present

## 2022-02-28 DIAGNOSIS — C61 Malignant neoplasm of prostate: Secondary | ICD-10-CM | POA: Diagnosis not present

## 2022-03-05 DIAGNOSIS — Z0000 Encounter for general adult medical examination without abnormal findings: Secondary | ICD-10-CM | POA: Diagnosis not present

## 2022-03-05 DIAGNOSIS — F330 Major depressive disorder, recurrent, mild: Secondary | ICD-10-CM | POA: Diagnosis not present

## 2022-03-05 DIAGNOSIS — N400 Enlarged prostate without lower urinary tract symptoms: Secondary | ICD-10-CM | POA: Diagnosis not present

## 2022-03-05 DIAGNOSIS — E7849 Other hyperlipidemia: Secondary | ICD-10-CM | POA: Diagnosis not present

## 2022-03-05 DIAGNOSIS — I10 Essential (primary) hypertension: Secondary | ICD-10-CM | POA: Diagnosis not present

## 2022-04-29 DIAGNOSIS — D075 Carcinoma in situ of prostate: Secondary | ICD-10-CM | POA: Diagnosis not present

## 2022-04-29 DIAGNOSIS — Z6828 Body mass index (BMI) 28.0-28.9, adult: Secondary | ICD-10-CM | POA: Diagnosis not present

## 2022-04-29 DIAGNOSIS — N23 Unspecified renal colic: Secondary | ICD-10-CM | POA: Diagnosis not present

## 2022-05-06 DIAGNOSIS — R319 Hematuria, unspecified: Secondary | ICD-10-CM | POA: Diagnosis not present

## 2022-05-06 DIAGNOSIS — N200 Calculus of kidney: Secondary | ICD-10-CM | POA: Diagnosis not present

## 2022-06-04 DIAGNOSIS — E7849 Other hyperlipidemia: Secondary | ICD-10-CM | POA: Diagnosis not present

## 2022-06-04 DIAGNOSIS — N23 Unspecified renal colic: Secondary | ICD-10-CM | POA: Diagnosis not present

## 2022-06-04 DIAGNOSIS — I10 Essential (primary) hypertension: Secondary | ICD-10-CM | POA: Diagnosis not present

## 2022-06-04 DIAGNOSIS — N400 Enlarged prostate without lower urinary tract symptoms: Secondary | ICD-10-CM | POA: Diagnosis not present

## 2022-06-04 DIAGNOSIS — D075 Carcinoma in situ of prostate: Secondary | ICD-10-CM | POA: Diagnosis not present

## 2022-06-04 DIAGNOSIS — F330 Major depressive disorder, recurrent, mild: Secondary | ICD-10-CM | POA: Diagnosis not present

## 2022-09-04 DIAGNOSIS — G43909 Migraine, unspecified, not intractable, without status migrainosus: Secondary | ICD-10-CM | POA: Diagnosis not present

## 2022-09-04 DIAGNOSIS — I10 Essential (primary) hypertension: Secondary | ICD-10-CM | POA: Diagnosis not present

## 2022-09-04 DIAGNOSIS — E7849 Other hyperlipidemia: Secondary | ICD-10-CM | POA: Diagnosis not present

## 2022-09-04 DIAGNOSIS — N1831 Chronic kidney disease, stage 3a: Secondary | ICD-10-CM | POA: Diagnosis not present

## 2022-10-31 DIAGNOSIS — C61 Malignant neoplasm of prostate: Secondary | ICD-10-CM | POA: Diagnosis not present

## 2022-11-07 DIAGNOSIS — C61 Malignant neoplasm of prostate: Secondary | ICD-10-CM | POA: Diagnosis not present

## 2022-12-10 DIAGNOSIS — I700 Atherosclerosis of aorta: Secondary | ICD-10-CM | POA: Diagnosis not present

## 2022-12-10 DIAGNOSIS — I10 Essential (primary) hypertension: Secondary | ICD-10-CM | POA: Diagnosis not present

## 2022-12-10 DIAGNOSIS — E7849 Other hyperlipidemia: Secondary | ICD-10-CM | POA: Diagnosis not present

## 2022-12-10 DIAGNOSIS — N1831 Chronic kidney disease, stage 3a: Secondary | ICD-10-CM | POA: Diagnosis not present

## 2022-12-10 DIAGNOSIS — G43909 Migraine, unspecified, not intractable, without status migrainosus: Secondary | ICD-10-CM | POA: Diagnosis not present

## 2022-12-10 DIAGNOSIS — N400 Enlarged prostate without lower urinary tract symptoms: Secondary | ICD-10-CM | POA: Diagnosis not present

## 2023-03-12 DIAGNOSIS — Z6827 Body mass index (BMI) 27.0-27.9, adult: Secondary | ICD-10-CM | POA: Diagnosis not present

## 2023-03-12 DIAGNOSIS — Z0000 Encounter for general adult medical examination without abnormal findings: Secondary | ICD-10-CM | POA: Diagnosis not present

## 2023-06-17 DIAGNOSIS — N400 Enlarged prostate without lower urinary tract symptoms: Secondary | ICD-10-CM | POA: Diagnosis not present

## 2023-06-17 DIAGNOSIS — F330 Major depressive disorder, recurrent, mild: Secondary | ICD-10-CM | POA: Diagnosis not present

## 2023-06-17 DIAGNOSIS — I700 Atherosclerosis of aorta: Secondary | ICD-10-CM | POA: Diagnosis not present

## 2023-06-17 DIAGNOSIS — Z0000 Encounter for general adult medical examination without abnormal findings: Secondary | ICD-10-CM | POA: Diagnosis not present

## 2023-06-17 DIAGNOSIS — E7849 Other hyperlipidemia: Secondary | ICD-10-CM | POA: Diagnosis not present

## 2023-06-17 DIAGNOSIS — I10 Essential (primary) hypertension: Secondary | ICD-10-CM | POA: Diagnosis not present

## 2023-06-17 DIAGNOSIS — D075 Carcinoma in situ of prostate: Secondary | ICD-10-CM | POA: Diagnosis not present

## 2023-07-01 DIAGNOSIS — R0600 Dyspnea, unspecified: Secondary | ICD-10-CM | POA: Diagnosis not present

## 2023-07-01 DIAGNOSIS — R0602 Shortness of breath: Secondary | ICD-10-CM | POA: Diagnosis not present

## 2023-07-08 DIAGNOSIS — C61 Malignant neoplasm of prostate: Secondary | ICD-10-CM | POA: Diagnosis not present

## 2023-07-15 DIAGNOSIS — C61 Malignant neoplasm of prostate: Secondary | ICD-10-CM | POA: Diagnosis not present

## 2023-09-23 DIAGNOSIS — I10 Essential (primary) hypertension: Secondary | ICD-10-CM | POA: Diagnosis not present

## 2023-09-23 DIAGNOSIS — N400 Enlarged prostate without lower urinary tract symptoms: Secondary | ICD-10-CM | POA: Diagnosis not present

## 2023-09-23 DIAGNOSIS — E7849 Other hyperlipidemia: Secondary | ICD-10-CM | POA: Diagnosis not present

## 2023-09-23 DIAGNOSIS — G43909 Migraine, unspecified, not intractable, without status migrainosus: Secondary | ICD-10-CM | POA: Diagnosis not present

## 2023-11-10 IMAGING — MR MR PROSTATE WO/W CM
12 series · 48 of 48 positions shown · IV contrast (multihance)
Comparison: CT pelvis 09/21/2019

CLINICAL DATA: Family history prostate cancer. PSA level of 0.59 on
12/19/2020

EXAM:
MR PROSTATE WITHOUT AND WITH CONTRAST
TECHNIQUE: Multiplanar multisequence MRI images were obtained of the pelvis
centered about the prostate. Pre and post contrast images were
obtained.
CONTRAST:  17mL MULTIHANCE GADOBENATE DIMEGLUMINE 529 MG/ML IV SOLN

[Series 3: T2 · coronal · 3.0mm · 0.56mm/px · 1 of 23 slices shown (1 of 3)]
[im 1/23]
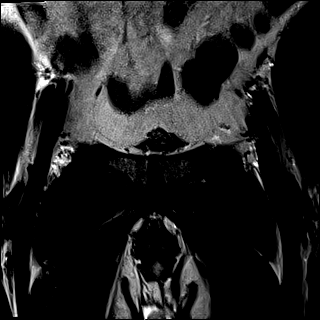

[Series 4: T1 · axial · 5.0mm · 1.25mm/px · z∈[-11,+184]mm · 2 of 80 slices shown]
[im 1/80]
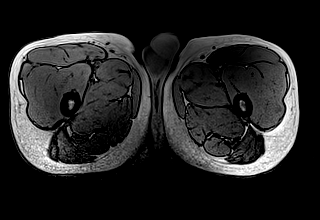
[im 80/80]
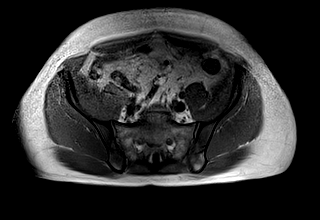

[Series 5: DWI · axial · 3.0mm · 1.75mm/px · z∈[+61,+136]mm · 2 of 78 slices shown (1 of 3)]
[im 1/78]
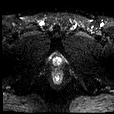
[im 78/78]
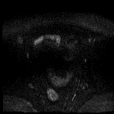

[Series 6: DWI · axial · 3.0mm · 1.75mm/px · 1 of 26 slices shown (2 of 3)]
[im 1/26]
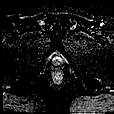

[Series 7: DWI · axial · 3.0mm · 1.75mm/px · 1 of 26 slices shown (3 of 3)]
[im 1/26]
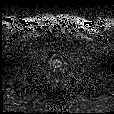

[Series 8: T2 · axial · 3.0mm · 0.56mm/px · 1 of 26 slices shown (2 of 3)]
[im 1/26]
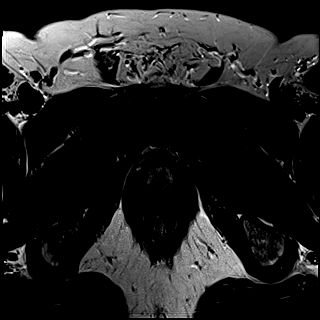

[Series 9: T2 · axial · 1.0mm · 1.04mm/px · z∈[+63,+134]mm · 2 of 72 slices shown (3 of 3)]
[im 1/72]
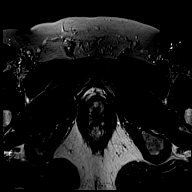
[im 72/72]
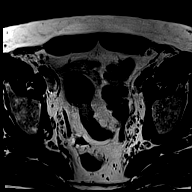

[Series 10: pre t1_twist_tra_dyn · axial · non-contrast · 3.5mm · 0.83mm/px · 1 of 20 slices shown]
[im 1/20]
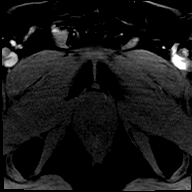

[Series 11: post t1_twist_tra_dyn-copy center · axial · non-contrast · 3.5mm · 0.83mm/px · z∈[+66,+132]mm · 17 of 600 slices shown]
[im 1/600]
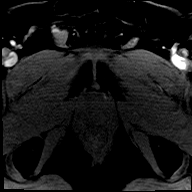
[im 38/600]
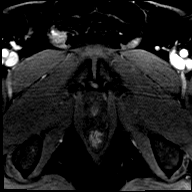
[im 75/600]
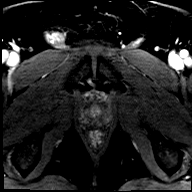
[im 113/600]
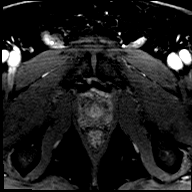
[im 150/600]
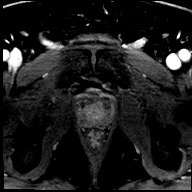
[im 188/600]
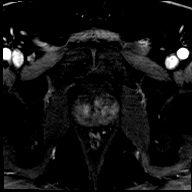
[im 225/600]
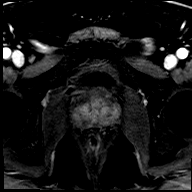
[im 263/600]
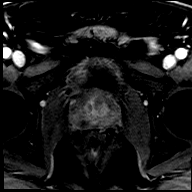
[im 300/600]
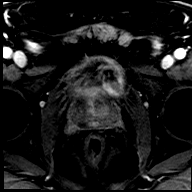
[im 337/600]
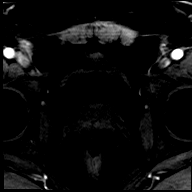
[im 375/600]
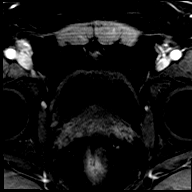
[im 412/600]
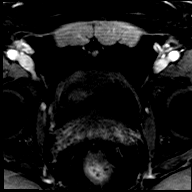
[im 450/600]
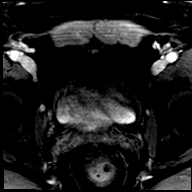
[im 487/600]
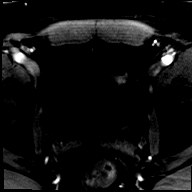
[im 525/600]
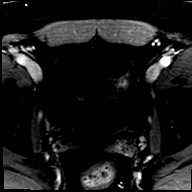
[im 562/600]
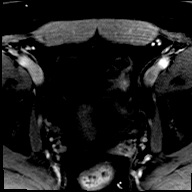
[im 600/600]
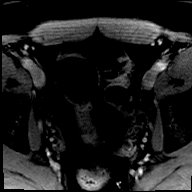

[Series 12: post t1_twist_tra_dyn-copy cent_sub · axial · 3.5mm · 0.83mm/px · z∈[+66,+132]mm · 16 of 580 slices shown]
[im 1/580]
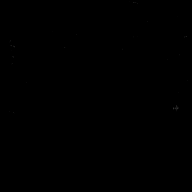
[im 39/580]
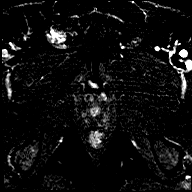
[im 78/580]
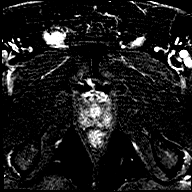
[im 116/580]
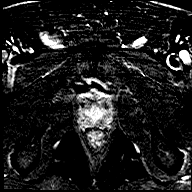
[im 155/580]
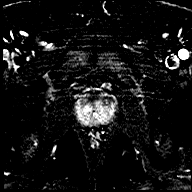
[im 194/580]
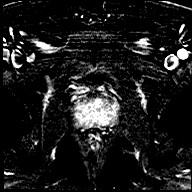
[im 232/580]
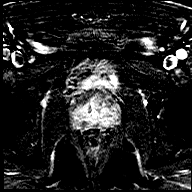
[im 271/580]
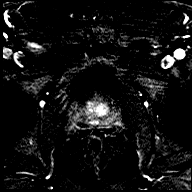
[im 309/580]
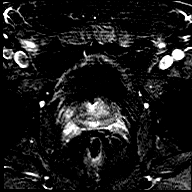
[im 348/580]
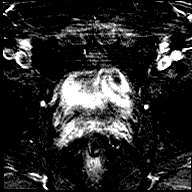
[im 387/580]
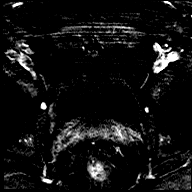
[im 425/580]
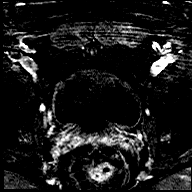
[im 464/580]
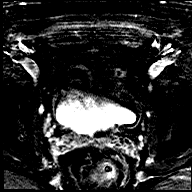
[im 502/580]
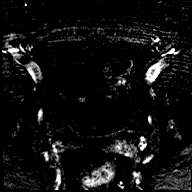
[im 541/580]
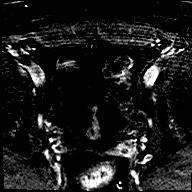
[im 580/580]
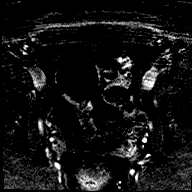

[Series 13: t1_vibe_dixon_tra_f · axial · 2.5mm · 0.91mm/px · z∈[-12,+185]mm · 2 of 80 slices shown]
[im 1/80]
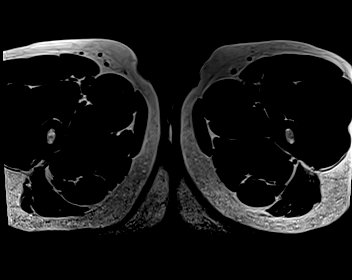
[im 80/80]
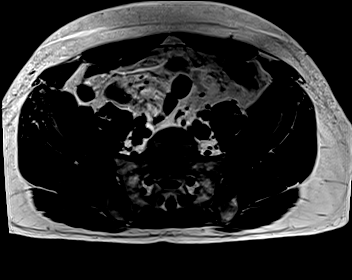

[Series 14: t1_vibe_dixon_tra_w · axial · 2.5mm · 0.91mm/px · z∈[-12,+185]mm · 2 of 80 slices shown]
[im 1/80]
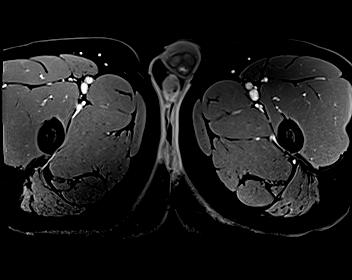
[im 80/80]
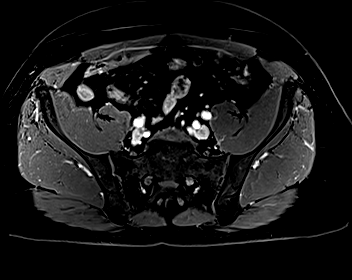

[48 of 48 positions shown; findings below may reference images not displayed]

FINDINGS: Prostate:

Background hazy low T2 signal stranding in a nonfocal manner
throughout the peripheral zone although with some sparing of the
anterior peripheral zone bilaterally. This is likely
postinflammatory and considered PI-RADS category 2.

Region of interest # 1: PI-RADS category 4 lesion of the left
posterolateral peripheral zone in the mid gland. This demonstrates
focal low T2 signal as shown on image 45 series 9, with associated
focal early enhancement as shown on image 93 series 12. This lesion
measures 0.27 cc (0.9 by 0.6 by 0.7 cm).

There is some mild encapsulated nodularity in the transition zone
compatible with minimal benign prostatic hypertrophy.

Volume: 3D volumetric analysis: Prostate volume 25.53 cc (4.4 by
by 3.9 cm).

Transcapsular spread:  Absent

Seminal vesicle involvement: Absent

Neurovascular bundle involvement: Absent

Pelvic adenopathy: Absent

Bone metastasis: Absent

Other findings: Mild sigmoid colon diverticulosis.
IMPRESSION: 1. Small PI-RADS category 4 lesion of the left posterolateral
peripheral zone in the mid gland. Targeting data sent to [facility].
2. Mild benign prostatic hypertrophy without prostatomegaly.
3. Mild sigmoid colon diverticulosis.

## 2024-01-06 DIAGNOSIS — N1831 Chronic kidney disease, stage 3a: Secondary | ICD-10-CM | POA: Diagnosis not present

## 2024-01-06 DIAGNOSIS — F330 Major depressive disorder, recurrent, mild: Secondary | ICD-10-CM | POA: Diagnosis not present

## 2024-01-06 DIAGNOSIS — E7849 Other hyperlipidemia: Secondary | ICD-10-CM | POA: Diagnosis not present

## 2024-01-06 DIAGNOSIS — D075 Carcinoma in situ of prostate: Secondary | ICD-10-CM | POA: Diagnosis not present

## 2024-01-06 DIAGNOSIS — I700 Atherosclerosis of aorta: Secondary | ICD-10-CM | POA: Diagnosis not present

## 2024-01-06 DIAGNOSIS — I10 Essential (primary) hypertension: Secondary | ICD-10-CM | POA: Diagnosis not present

## 2024-03-01 ENCOUNTER — Other Ambulatory Visit: Payer: Self-pay | Admitting: Urology

## 2024-03-01 DIAGNOSIS — C61 Malignant neoplasm of prostate: Secondary | ICD-10-CM

## 2024-04-07 ENCOUNTER — Other Ambulatory Visit
